# Patient Record
Sex: Male | Born: 2000 | Race: White | Hispanic: No | Marital: Single | Smoking: Never smoker
Health system: Southern US, Community
[De-identification: ages and names within clinical notes are randomized; demographics above are authoritative.]

## PROBLEM LIST (undated history)

## (undated) DIAGNOSIS — S42009A Fracture of unspecified part of unspecified clavicle, initial encounter for closed fracture: Secondary | ICD-10-CM

## (undated) HISTORY — PX: NO PAST SURGERIES: SHX2092

---
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
## 2022-05-08 ENCOUNTER — Ambulatory Visit: Payer: BC Managed Care – PPO | Admitting: Family Medicine

## 2022-05-08 ENCOUNTER — Telehealth: Payer: Self-pay

## 2022-05-08 NOTE — Telephone Encounter (Signed)
Tried to reach out to patient to get him in as a new patient to see Joie Bimler as PCP. Which wont be til April. 9.

## 2022-05-23 ENCOUNTER — Ambulatory Visit: Payer: BC Managed Care – PPO | Admitting: Physician Assistant

## 2024-02-14 ENCOUNTER — Ambulatory Visit
Admission: EM | Admit: 2024-02-14 | Discharge: 2024-02-14 | Disposition: A | Discharge status: Home / Self Care | Attending: Family Medicine | Admitting: Family Medicine

## 2024-02-14 DIAGNOSIS — R112 Nausea with vomiting, unspecified: Secondary | ICD-10-CM

## 2024-02-14 DIAGNOSIS — K529 Noninfective gastroenteritis and colitis, unspecified: Secondary | ICD-10-CM | POA: Diagnosis not present

## 2024-02-14 MED ORDER — ONDANSETRON HCL 4 MG/2ML IJ SOLN
4.0000 mg | Freq: Once | INTRAMUSCULAR | Status: AC
  Administered 2024-02-14: 4 mg via INTRAMUSCULAR

## 2024-02-14 MED ORDER — ONDANSETRON 4 MG PO TBDP: 4.0000 mg | ORAL_TABLET | Freq: Three times a day (TID) | ORAL | 0 refills | Status: AC | PRN

## 2024-02-14 NOTE — ED Provider Notes (Signed)
 " [facility]    CSN: [id number] Arrival date & time: 02/14/24  1512      History   Chief Complaint Chief Complaint  Patient presents with   Emesis   Fever    HPI Bobby Marshall is a 24 y.o. male  presents for evaluation of URI symptoms for 1 day. Patient reports associated symptoms of nausea and vomiting with low-grade fever. Denies diarrhea, cough, congestion, sore throat, body aches or shortness of breath.  Denies recent travel or ingestion of contaminated or undercooked foods.  No sick contacts.  Denies history of GERD, IBS, Crohn's or colitis.  Has been able to keep water down but no food.  Pt has taken nothing OTC for symptoms. Pt has no other concerns at this time.    Emesis Associated symptoms: fever   Fever Associated symptoms: nausea and vomiting     Past Medical History:  Diagnosis Date   Collar bone fracture    broken both, total 3x     There are no active problems to display for this patient.   Past Surgical History:  Procedure Laterality Date   NO PAST SURGERIES         Home Medications    Prior to Admission medications  Medication Sig Start Date End Date Taking? Authorizing Provider  ondansetron (ZOFRAN-ODT) 4 MG disintegrating tablet Take 1 tablet (4 mg total) by mouth every 8 (eight) hours as needed for nausea or vomiting. 02/14/24  Yes Lutricia Widjaja, Jodi R, NP  ibuprofen  (ADVIL ,MOTRIN ) 600 MG tablet Take 1 tablet (600 mg total) by mouth every 6 (six) hours as needed. 02/07/17   Van Knee, MD    Family History History reviewed. No pertinent family history.  Social History Social History[1]   Allergies   Patient has no known allergies.   Review of Systems Review of Systems  Constitutional:  Positive for fever.  Gastrointestinal:  Positive for nausea and vomiting.     Physical Exam Triage Vital Signs ED Triage Vitals  Encounter Vitals Group     BP 02/14/24 1608 113/73     Girls Systolic BP Percentile --      Girls  Diastolic BP Percentile --      Boys Systolic BP Percentile --      Boys Diastolic BP Percentile --      Pulse Rate 02/14/24 1608 67     Resp 02/14/24 1608 17     Temp 02/14/24 1608 99.3 F (37.4 C)     Temp Source 02/14/24 1608 Oral     SpO2 02/14/24 1608 98 %     Weight 02/14/24 1607 150 lb (68 kg)     Height --      Head Circumference --      Peak Flow --      Pain Score 02/14/24 1607 5     Pain Loc --      Pain Education --      Exclude from Growth Chart --    No data found.  Updated Vital Signs BP 113/73 (BP Location: Right Arm)   Pulse 67   Temp 99.3 F (37.4 C) (Oral)   Resp 17   Wt 150 lb (68 kg)   SpO2 98%   BMI 21.52 kg/m   Visual Acuity Right Eye Distance:   Left Eye Distance:   Bilateral Distance:    Right Eye Near:   Left Eye Near:    Bilateral Near:     Physical Exam Vitals and nursing  note reviewed.  Constitutional:      General: He is not in acute distress.    Appearance: Normal appearance. He is not ill-appearing.  HENT:     Head: Normocephalic and atraumatic.  Eyes:     Pupils: Pupils are equal, round, and reactive to light.  Cardiovascular:     Rate and Rhythm: Normal rate.  Pulmonary:     Effort: Pulmonary effort is normal.  Abdominal:     General: Abdomen is flat. Bowel sounds are normal. There is no distension.     Palpations: Abdomen is soft. There is no hepatomegaly or splenomegaly.     Tenderness: There is no abdominal tenderness. There is no guarding or rebound.  Skin:    General: Skin is warm and dry.  Neurological:     General: No focal deficit present.     Mental Status: He is alert and oriented to person, place, and time.  Psychiatric:        Mood and Affect: Mood normal.        Behavior: Behavior normal.      UC Treatments / Results  Labs (all labs ordered are listed, but only abnormal results are displayed) Labs Reviewed - No data to display  EKG   Radiology No results found.  Procedures Procedures  (including critical care time)  Medications Ordered in UC Medications  ondansetron (ZOFRAN) injection 4 mg (4 mg Intramuscular Given 02/14/24 1612)    Initial Impression / Assessment and Plan / UC Course  I have reviewed the triage vital signs and the nursing notes.  Pertinent labs & imaging results that were available during my care of the patient were reviewed by me and considered in my medical decision making (see chart for details).     Patient given IM Zofran for nausea vomiting as he states he been unable to tolerate anything orally.  Patient reports improvement in symptoms.  Discussed with patient likely viral enteritis.  Given symptoms less than 24 hours will not do any POCT testing at this time.  Rx for Zofran sent to pharmacy and discussed BRAT diet as well as hydration/electrolyte replacement.  PCP follow-up 2 days for recheck.  ER precautions reviewed. Final Clinical Impressions(s) / UC Diagnoses   Final diagnoses:  Nausea and vomiting, unspecified vomiting type  Gastroenteritis     Discharge Instructions      You may take Zofran every 8 hours for your nausea and vomiting as needed.  You were given an injection of this medication in the clinic.  Focus on hydration/electrolyte replacement with Gatorade, Powerade, Pedialyte, water.  Brat diet/bland food advance as you tolerate.  This includes bananas, rice, applesauce, toast.  Lots of rest.  Follow-up with your PCP in 2 days for recheck.  Please go to the ER for any worsening symptoms.  Hope you feel better soon!     ED Prescriptions     Medication Sig Dispense Auth. Provider   ondansetron (ZOFRAN-ODT) 4 MG disintegrating tablet Take 1 tablet (4 mg total) by mouth every 8 (eight) hours as needed for nausea or vomiting. 10 tablet Harshitha Fretz, Jodi R, NP      PDMP not reviewed this encounter.     [1]  Social History Tobacco Use   Smoking status: Never   Smokeless tobacco: Never  Vaping Use   Vaping status: Never Used   Substance Use Topics   Alcohol use: No   Drug use: No     Loreda Myla SAUNDERS, NP 02/14/24 1637  "

## 2024-02-14 NOTE — ED Triage Notes (Addendum)
 Sx 1 day Emesis Nausea Fever Abdominal pain

## 2024-02-14 NOTE — Discharge Instructions (Addendum)
 You may take Zofran every 8 hours for your nausea and vomiting as needed.  You were given an injection of this medication in the clinic.  Focus on hydration/electrolyte replacement with Gatorade, Powerade, Pedialyte, water.  Brat diet/bland food advance as you tolerate.  This includes bananas, rice, applesauce, toast.  Lots of rest.  Follow-up with your PCP in 2 days for recheck.  Please go to the ER for any worsening symptoms.  Hope you feel better soon!
